# Patient Record
Sex: Male | Born: 1990 | Smoking: Current every day smoker
Health system: Southern US, Community
[De-identification: ages and names within clinical notes are randomized; demographics above are authoritative.]

---

## 2015-11-22 ENCOUNTER — Encounter (HOSPITAL_COMMUNITY): Payer: Self-pay | Admitting: Nurse Practitioner

## 2015-11-22 ENCOUNTER — Emergency Department (HOSPITAL_COMMUNITY): Payer: No Typology Code available for payment source

## 2015-11-22 ENCOUNTER — Emergency Department (HOSPITAL_COMMUNITY)
Admission: EM | Admit: 2015-11-22 | Discharge: 2015-11-22 | Disposition: A | Discharge status: Home / Self Care | Payer: No Typology Code available for payment source | Attending: Emergency Medicine | Admitting: Emergency Medicine

## 2015-11-22 DIAGNOSIS — Y92410 Unspecified street and highway as the place of occurrence of the external cause: Secondary | ICD-10-CM | POA: Diagnosis not present

## 2015-11-22 DIAGNOSIS — Y999 Unspecified external cause status: Secondary | ICD-10-CM | POA: Diagnosis not present

## 2015-11-22 DIAGNOSIS — F17200 Nicotine dependence, unspecified, uncomplicated: Secondary | ICD-10-CM | POA: Diagnosis not present

## 2015-11-22 DIAGNOSIS — S8992XA Unspecified injury of left lower leg, initial encounter: Secondary | ICD-10-CM | POA: Insufficient documentation

## 2015-11-22 DIAGNOSIS — Y9389 Activity, other specified: Secondary | ICD-10-CM | POA: Diagnosis not present

## 2015-11-22 DIAGNOSIS — S0990XA Unspecified injury of head, initial encounter: Secondary | ICD-10-CM | POA: Insufficient documentation
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
